# Patient Record
Sex: Female | Born: 1970 | Race: Black or African American | Hispanic: No | Marital: Married
Health system: Southern US, Community
[De-identification: ages and names within clinical notes are randomized; demographics above are authoritative.]

## PROBLEM LIST (undated history)

## (undated) HISTORY — PX: OTHER SURGICAL HISTORY: SHX169

---

## 2018-08-21 ENCOUNTER — Emergency Department (HOSPITAL_COMMUNITY): Payer: BLUE CROSS/BLUE SHIELD

## 2018-08-21 ENCOUNTER — Encounter (HOSPITAL_COMMUNITY): Payer: Self-pay | Admitting: Emergency Medicine

## 2018-08-21 ENCOUNTER — Emergency Department (HOSPITAL_COMMUNITY)
Admission: EM | Admit: 2018-08-21 | Discharge: 2018-08-21 | Disposition: A | Discharge status: Home / Self Care | Payer: BLUE CROSS/BLUE SHIELD | Attending: Emergency Medicine | Admitting: Emergency Medicine

## 2018-08-21 DIAGNOSIS — M549 Dorsalgia, unspecified: Secondary | ICD-10-CM | POA: Diagnosis not present

## 2018-08-21 DIAGNOSIS — R0789 Other chest pain: Secondary | ICD-10-CM | POA: Insufficient documentation

## 2018-08-21 LAB — I-STAT TROPONIN, ED
TROPONIN I, POC: 0.02 ng/mL (ref 0.00–0.08)
Troponin i, poc: 0 ng/mL (ref 0.00–0.08)

## 2018-08-21 LAB — I-STAT BETA HCG BLOOD, ED (MC, WL, AP ONLY): I-stat hCG, quantitative: <5 m[IU]/mL (ref <5)

## 2018-08-21 LAB — BASIC METABOLIC PANEL
Anion gap: 7 (ref 5–15)
BUN: 12 mg/dL (ref 6–20)
CALCIUM: 9.3 mg/dL (ref 8.9–10.3)
CHLORIDE: 105 mmol/L (ref 98–111)
CO2: 25 mmol/L (ref 22–32)
CREATININE: 0.79 mg/dL (ref 0.44–1.00)
GFR calc Af Amer: >60 mL/min (ref >60)
GFR calc non Af Amer: >60 mL/min (ref >60)
GLUCOSE: 91 mg/dL (ref 70–99)
Potassium: 4.2 mmol/L (ref 3.5–5.1)
SODIUM: 137 mmol/L (ref 135–145)

## 2018-08-21 LAB — CBC
HCT: 37.4 % (ref 36.0–46.0)
Hemoglobin: 12.4 g/dL (ref 12.0–15.0)
MCH: 31.8 pg (ref 26.0–34.0)
MCHC: 33.2 g/dL (ref 30.0–36.0)
MCV: 95.9 fL (ref 78.0–100.0)
PLATELETS: 399 10*3/uL (ref 150–400)
RBC: 3.9 MIL/uL (ref 3.87–5.11)
RDW: 12.3 % (ref 11.5–15.5)
WBC: 7.4 10*3/uL (ref 4.0–10.5)

## 2018-08-21 MED ORDER — IBUPROFEN 800 MG PO TABS: 800.0000 mg | ORAL_TABLET | Freq: Three times a day (TID) | ORAL | 0 refills | Status: AC | PRN

## 2018-08-21 NOTE — ED Triage Notes (Signed)
Pt states 1 day of left sided chest pain radiating into the neck, shoulder blade and down the left arm. Pain is worse with movement and lifting the left arm.

## 2018-08-21 NOTE — Discharge Instructions (Addendum)
You were evaluated in the emergency department for upper back stabbing pain through to the chest.  You had a chest x-ray EKG and blood work that did not show any obvious injury to your heart or other explanation for your pain.  This is likely muscular and we are prescribing you some ibuprofen.  You can also try some topical treatments including ice and heat.  Please return if any worsening symptoms and otherwise follow-up with your doctor.

## 2018-08-21 NOTE — ED Provider Notes (Signed)
[facility] EMERGENCY DEPARTMENT Provider Note   CSN: [id number] Arrival date & time: 08/21/18  1137     History   Chief Complaint Chief Complaint  Patient presents with  . Chest Pain    HPI Debra Rios is a 47 y.o. female.  She is here with complaint of on and off upper back pain since yesterday.  She says it is in her left trapezius sharp stabbing and it radiates straight through her chest into her left upper chest.  It sometimes radiates a little bit down her left arm.  Sharp in nature.  She says she will move into a specific position and the pain occurs and lasts for a few seconds and if she holds still it will relax some.  She thought it was muscular but when she went to the pharmacy and told him about it they recommended she come here to make sure it was not her heart.  She has no prior history of cardiac disease.  She does not recall any trauma.  There is no numbness or weakness.  Is not associated with any shortness of breath.  No fevers no cough.  The history is provided by the patient.  Chest Pain   This is a new problem. The current episode started yesterday. Episode frequency: intermittent with position. The problem has not changed since onset.The pain is associated with movement and raising an arm. The pain is present in the lateral region. The pain is moderate. The quality of the pain is described as brief and sharp. The pain radiates to the upper back, left shoulder and left arm. The symptoms are aggravated by certain positions. Associated symptoms include back pain (upper back/trapezius). Pertinent negatives include no abdominal pain, no cough, no diaphoresis, no fever, no leg pain, no lower extremity edema, no nausea, no numbness, no shortness of breath, no vomiting and no weakness. She has tried nothing for the symptoms. The treatment provided no relief.    History reviewed. No pertinent past medical history.  There are no active problems to display  for this patient.   Past Surgical History:  Procedure Laterality Date  . shoulder injury       OB History   None      Home Medications    Prior to Admission medications   Medication Sig Start Date End Date Taking? Authorizing Provider  ibuprofen (ADVIL,MOTRIN) 800 MG tablet Take 1 tablet (800 mg total) by mouth every 8 (eight) hours as needed. 08/21/18   Terrilee Files, MD    Family History No family history on file.  Social History Social History   Tobacco Use  . Smoking status: Not on file  Substance Use Topics  . Alcohol use: Not on file  . Drug use: Not on file   Denies tobacco or drugs.   Allergies   Patient has no known allergies.   Review of Systems Review of Systems  Constitutional: Negative for diaphoresis and fever.  HENT: Negative for sore throat.   Eyes: Negative for visual disturbance.  Respiratory: Negative for cough and shortness of breath.   Cardiovascular: Positive for chest pain.  Gastrointestinal: Negative for abdominal pain, nausea and vomiting.  Genitourinary: Negative for dysuria.  Musculoskeletal: Positive for back pain (upper back/trapezius).  Skin: Negative for rash.  Neurological: Negative for weakness and numbness.     Physical Exam Updated Vital Signs BP (!) 146/91   Pulse 68   Temp 98.6 F (37 C)   Resp 18  LMP 06/13/2018   SpO2 98%   Physical Exam  Constitutional: She appears well-developed and well-nourished. No distress.  HENT:  Head: Normocephalic and atraumatic.  Eyes: Conjunctivae are normal.  Neck: Neck supple.  Cardiovascular: Normal rate, regular rhythm and normal pulses.  No murmur heard. Pulmonary/Chest: Effort normal and breath sounds normal. No respiratory distress.  Abdominal: Soft. There is no tenderness.  Musculoskeletal: She exhibits no edema.       Right lower leg: She exhibits no tenderness.       Left lower leg: She exhibits no tenderness.  She has some reproducibility of her pain with  palpation of her left trapezius.  Neurological: She is alert.  Skin: Skin is warm and dry.  Psychiatric: She has a normal mood and affect.  Nursing note and vitals reviewed.    ED Treatments / Results  Labs (all labs ordered are listed, but only abnormal results are displayed) Labs Reviewed  BASIC METABOLIC PANEL  CBC  I-STAT TROPONIN, ED  I-STAT BETA HCG BLOOD, ED ([facility], [facility], [facility] ONLY)  I-STAT TROPONIN, ED    EKG EKG Interpretation  Date/Time:  Saturday August 21 2018 11:47:07 EDT Ventricular Rate:  77 PR Interval:  162 QRS Duration: 76 QT Interval:  372 QTC Calculation: 420 R Axis:   58 Text Interpretation:  Normal sinus rhythm Possible Left atrial enlargement Septal infarct , age undetermined Abnormal ECG no prior ecg to compare with Confirmed by Meridee ScoreButler, Shemiah Rosch [phone]([id number]) on 08/21/2018 1:37:24 PM   Radiology Dg Chest 2 View  Result Date: 08/21/2018 CLINICAL DATA:  Left chest pain radiating into the left neck, shoulder and arm for 1 day. EXAM: CHEST - 2 VIEW COMPARISON:  None. FINDINGS: Lungs are clear. Heart size is normal. No pneumothorax or pleural fluid. No acute or focal bony abnormality. IMPRESSION: Negative chest. Electronically Signed   By: Drusilla Kannerhomas  Dalessio M.D.   On: 08/21/2018 12:35    Procedures Procedures (including critical care time)  Medications Ordered in ED Medications - No data to display   Initial Impression / Assessment and Plan / ED Course  I have reviewed the triage vital signs and the nursing notes.  Pertinent labs & imaging results that were available during my care of the patient were reviewed by me and considered in my medical decision making (see chart for details).  Clinical Course as of Aug 23 955  Sat Aug 21, 2018  [id number] 47 year old female with no prior cardiac history here with very atypical sharp stabbing pain from her upper back through her chest into her left upper chest.  Seems very positional in nature.  Her troponin here is negative  along with a benign-appearing EKG and chest x-ray.   [MB]  1358 She is PERC negative.   [MB]    Clinical Course User Index [MB] Terrilee FilesButler, Leane Loring C, MD    Final Clinical Impressions(s) / ED Diagnoses   Final diagnoses:  Chest wall pain  Upper back pain on left side    ED Discharge Orders    None       Terrilee FilesButler, Clois Treanor C, MD 08/22/18 [phone]

## 2018-08-22 ENCOUNTER — Encounter (HOSPITAL_COMMUNITY): Payer: Self-pay | Admitting: Emergency Medicine

## 2019-12-15 IMAGING — DX DG CHEST 2V
2 series · 2 of 2 positions shown · non-contrast
Comparison: None.

CLINICAL DATA: Left chest pain radiating into the left neck,
shoulder and arm for 1 day.

EXAM:
CHEST - 2 VIEW

[chest pa]
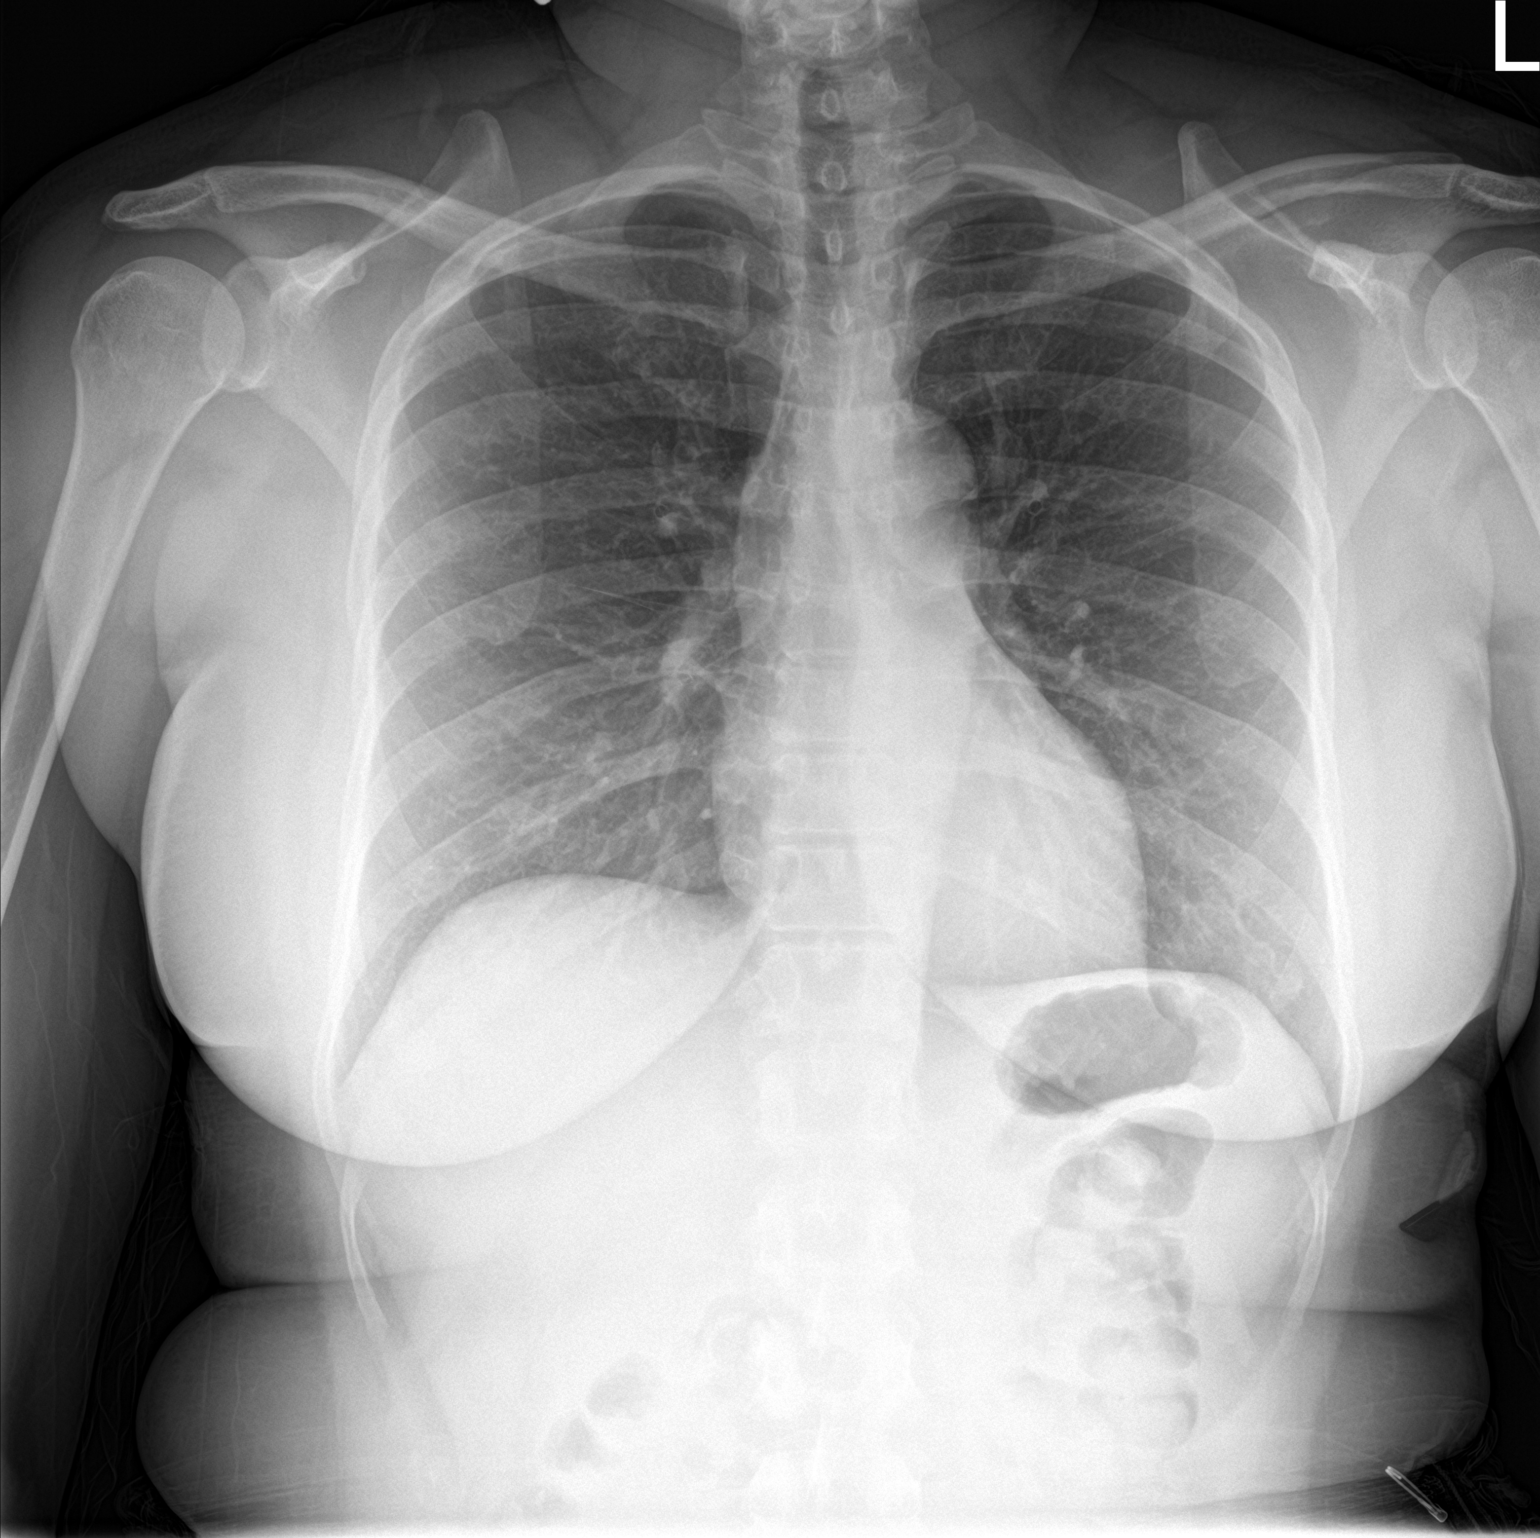

[chest lat]
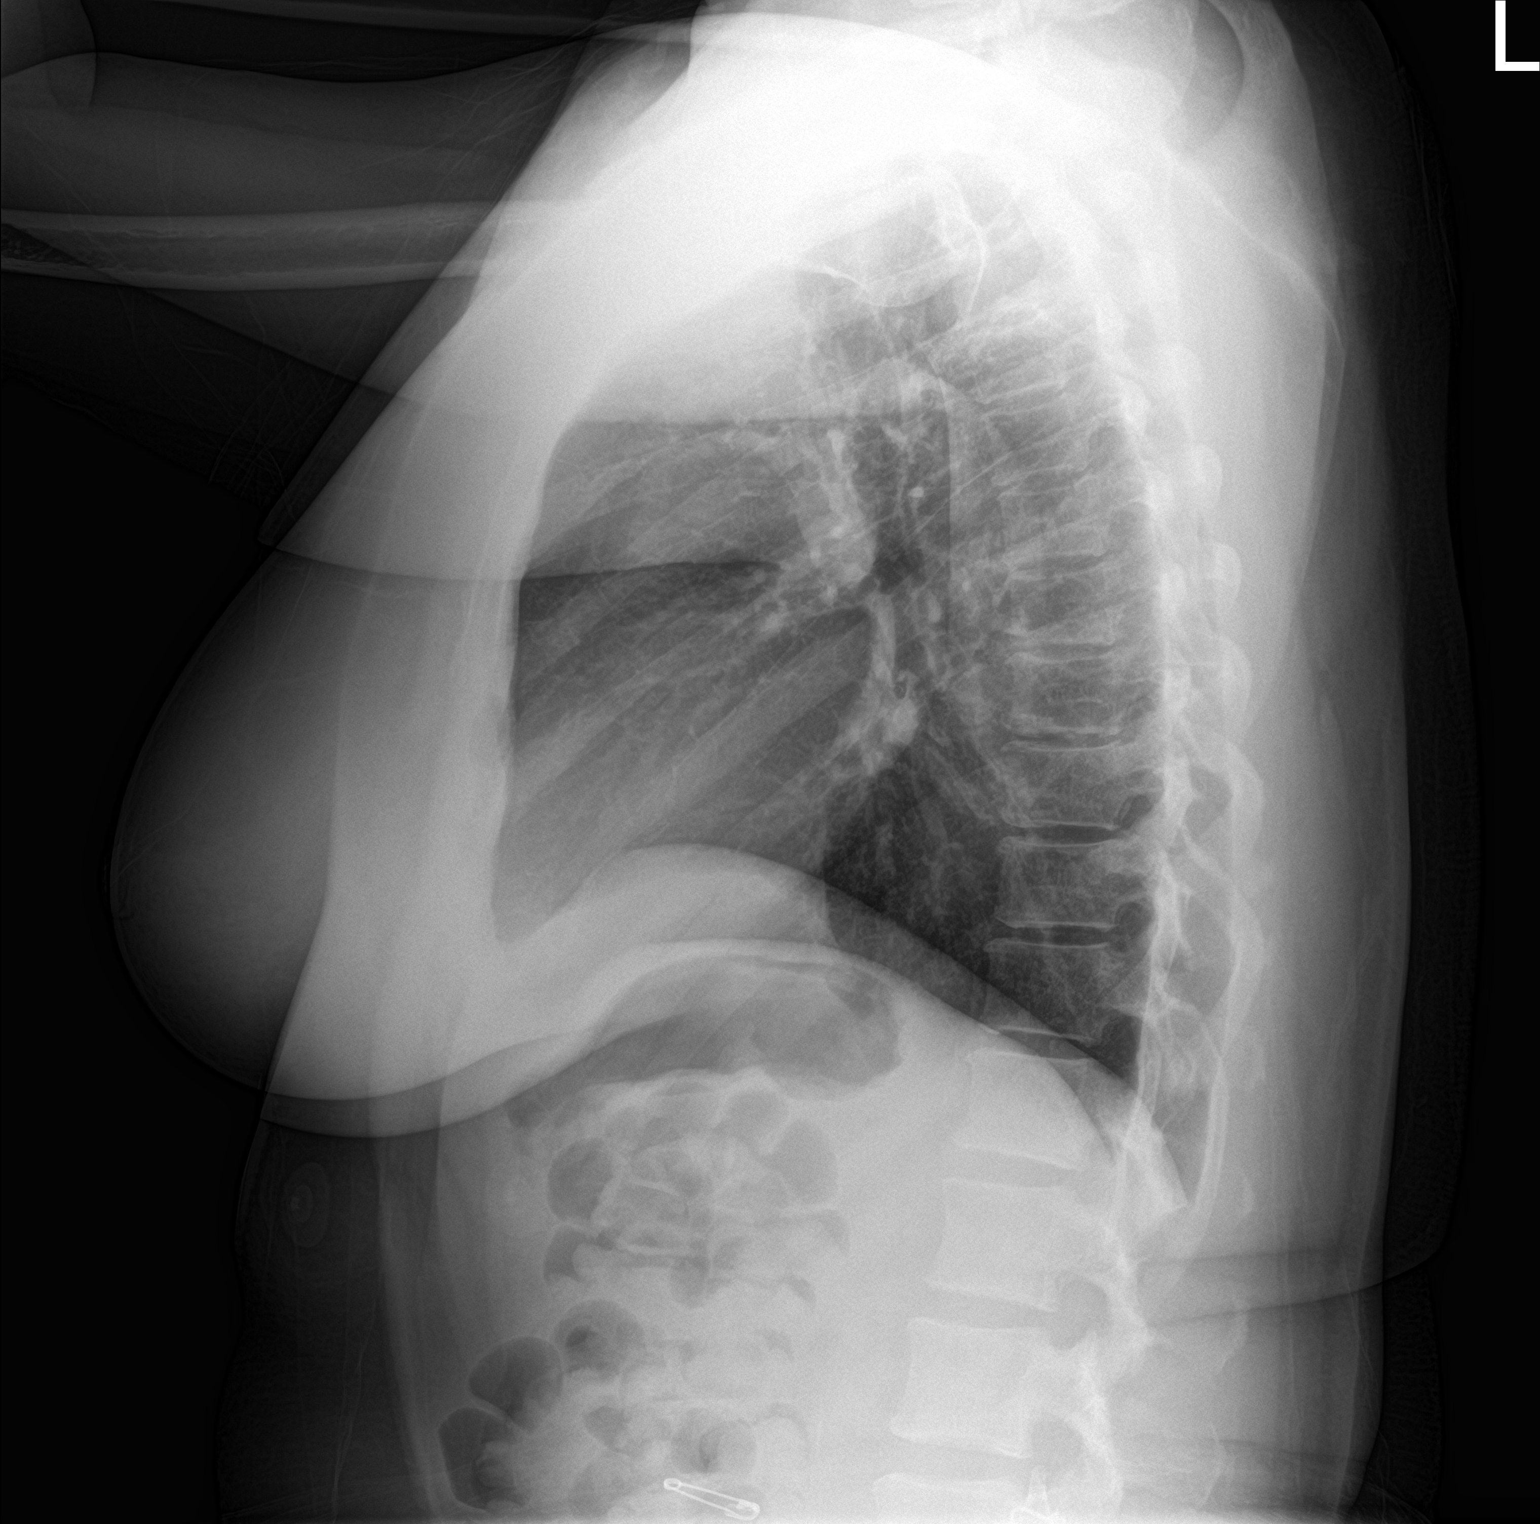

[2 of 2 positions shown; findings below may reference images not displayed]

FINDINGS: Lungs are clear. Heart size is normal. No pneumothorax or pleural
fluid. No acute or focal bony abnormality.
IMPRESSION: Negative chest.
